# Patient Record
Sex: Male | Born: 2000 | Race: White | Hispanic: No | Marital: Single | Smoking: Never smoker
Health system: Southern US, Community
[De-identification: ages and names within clinical notes are randomized; demographics above are authoritative.]

---

## 2020-06-10 ENCOUNTER — Emergency Department
Admission: EM | Admit: 2020-06-10 | Discharge: 2020-06-10 | Disposition: A | Discharge status: Home / Self Care | Payer: BC Managed Care – PPO | Source: Home / Self Care

## 2020-06-10 ENCOUNTER — Other Ambulatory Visit: Payer: Self-pay

## 2020-06-10 ENCOUNTER — Emergency Department (INDEPENDENT_AMBULATORY_CARE_PROVIDER_SITE_OTHER): Payer: BC Managed Care – PPO

## 2020-06-10 DIAGNOSIS — M25475 Effusion, left foot: Secondary | ICD-10-CM

## 2020-06-10 DIAGNOSIS — M25572 Pain in left ankle and joints of left foot: Secondary | ICD-10-CM

## 2020-06-10 DIAGNOSIS — M25472 Effusion, left ankle: Secondary | ICD-10-CM

## 2020-06-10 DIAGNOSIS — S93402A Sprain of unspecified ligament of left ankle, initial encounter: Secondary | ICD-10-CM

## 2020-06-10 DIAGNOSIS — M79672 Pain in left foot: Secondary | ICD-10-CM

## 2020-06-10 NOTE — ED Provider Notes (Signed)
[facility]    CSN: [id number] Arrival date & time: 06/10/20  1139      History   Chief Complaint Chief Complaint  Patient presents with  . Foot Injury    Left    HPI Adam Villegas is a 20 y.o. male.   [employer] left foot pain since yesterday.  Reports that he smashed his left foot with a large hammer at work.  Reports that he works in a [employer] and when turbines is [employer].  Reports that he has the area wrapped, reports redness, swelling, bruising.  Reports that he is unable to bear weight and walk normally on the left foot and ankle.  Denies previous injury.  Has taken ibuprofen and Tylenol with some relief.  Denies other injury, numbness, tingling, radiating pain, cracking or popping within the joint, other symptoms.  ROS per HPI  The history is provided by the patient.  Foot Injury   History reviewed. No pertinent past medical history.  There are no problems to display for this patient.   History reviewed. No pertinent surgical history.     Home Medications    Prior to Admission medications   Not on File    Family History Family History  Problem Relation Age of Onset  . Healthy Mother   . Healthy Father     Social History Social History   Tobacco Use  . Smoking status: Never Smoker  . Smokeless tobacco: Never Used  Vaping Use  . Vaping Use: Never used  Substance Use Topics  . Alcohol use: Not Currently     Allergies   Patient has no known allergies.   Review of Systems Review of Systems   Physical Exam Triage Vital Signs ED Triage Vitals [06/10/20 1156]  Enc Vitals Group     BP      Pulse      Resp      Temp      Temp src      SpO2      Weight      Height      Head Circumference      Peak Flow      Pain Score 6     Pain Loc      Pain Edu?      Excl. in GC?    No data found.  Updated Vital Signs BP 137/77 (BP Location: Left Arm)   Pulse 93   Temp 98.2 F (36.8 C) (Oral)   Resp 16   SpO2 97%    Visual Acuity Right Eye Distance:   Left Eye Distance:   Bilateral Distance:    Right Eye Near:   Left Eye Near:    Bilateral Near:     Physical Exam Vitals and nursing note reviewed.  Constitutional:      Appearance: Normal appearance. He is well-developed.  HENT:     Head: Normocephalic and atraumatic.     Nose: Nose normal.     Mouth/Throat:     Mouth: Mucous membranes are moist.     Pharynx: Oropharynx is clear.  Eyes:     Extraocular Movements: Extraocular movements intact.     Conjunctiva/sclera: Conjunctivae normal.     Pupils: Pupils are equal, round, and reactive to light.  Cardiovascular:     Rate and Rhythm: Normal rate and regular rhythm.     Heart sounds: Normal heart sounds. No murmur heard.   Pulmonary:     Effort: Pulmonary effort is normal. No  respiratory distress.     Breath sounds: Normal breath sounds.  Abdominal:     Palpations: Abdomen is soft.     Tenderness: There is no abdominal tenderness.  Musculoskeletal:        General: Swelling, tenderness and signs of injury present.     Cervical back: Normal range of motion and neck supple.     Comments: L ankle with erythema, swelling, ecchymosis, TTP, limited ROM  Skin:    General: Skin is warm and dry.     Capillary Refill: Capillary refill takes less than 2 seconds.  Neurological:     General: No focal deficit present.     Mental Status: He is alert and oriented to person, place, and time.  Psychiatric:        Mood and Affect: Mood normal.        Behavior: Behavior normal.        Thought Content: Thought content normal.      UC Treatments / Results  Labs (all labs ordered are listed, but only abnormal results are displayed) Labs Reviewed - No data to display  EKG   Radiology DG Ankle Complete Left  Result Date: 06/10/2020 CLINICAL DATA:  Dropped heavy object on ankle EXAM: LEFT ANKLE COMPLETE - 3+ VIEW COMPARISON:  None. FINDINGS: Frontal, oblique, and lateral views were obtained.  No fracture or joint effusion. The joint spaces appear normal. No erosive change. Ankle mortise appears intact. IMPRESSION: No fracture or arthropathy.  Ankle mortise appears intact. Electronically Signed   By: Bretta Bang III M.D.   On: 06/10/2020 12:25   DG Foot Complete Left  Result Date: 06/10/2020 CLINICAL DATA:  Dropped hammer on ankle.  Pain and swelling. EXAM: LEFT FOOT - COMPLETE 3+ VIEW COMPARISON:  None. FINDINGS: There is no evidence of fracture or dislocation. There is no evidence of arthropathy or other focal bone abnormality. Soft tissues are unremarkable. IMPRESSION: Negative. Electronically Signed   By: Kennith Center M.D.   On: 06/10/2020 12:25    Procedures Procedures (including critical care time)  Medications Ordered in UC Medications - No data to display  Initial Impression / Assessment and Plan / UC Course  I have reviewed the triage vital signs and the nursing notes.  Pertinent labs & imaging results that were available during my care of the patient were reviewed by me and considered in my medical decision making (see chart for details).    Left foot pain Left ankle pain Sprain of left ankle  X-rays are negative for any fractures or misalignments May continue ibuprofen and Tylenol as needed for pain Continue with ice and elevation Provided ASO brace to the left ankle in office Provided crutches May need to continue crutches through this week Follow-up with sports medicine or orthopedics if symptoms are persisting Follow-up with the ER for changes in sensation, changes in strength, unrelenting pain, other concerns  Final Clinical Impressions(s) / UC Diagnoses   Final diagnoses:  Left foot pain  Acute left ankle pain  Sprain of left ankle, unspecified ligament, initial encounter     Discharge Instructions     We have given you a lace up ankle brace as well as some crutches  Keep the area wrapped, use ice, may take ibuprofen, Tylenol as needed  for pain  Follow-up with orthopedics or sports medicine when you get home    ED Prescriptions    None     PDMP not reviewed this encounter.   Moshe Cipro, NP 06/10/20  1239  

## 2020-06-10 NOTE — Discharge Instructions (Signed)
We have given you a lace up ankle brace as well as some crutches  Keep the area wrapped, use ice, may take ibuprofen, Tylenol as needed for pain  Follow-up with orthopedics or sports medicine when you get home

## 2020-06-10 NOTE — ED Triage Notes (Signed)
Patient presents to Urgent Care with complaints of left foot pain since yesterday. 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 Swelling and ecchymosis noted, pt has ankle/foot wrapped w/ ace wrap upon arrival.

## 2021-11-13 IMAGING — DX DG ANKLE COMPLETE 3+V*L*
3 series · 3 of 3 positions shown · non-contrast
Comparison: None.

CLINICAL DATA: Dropped heavy object on ankle

EXAM:
LEFT ANKLE COMPLETE - 3+ VIEW

[ankle ap]
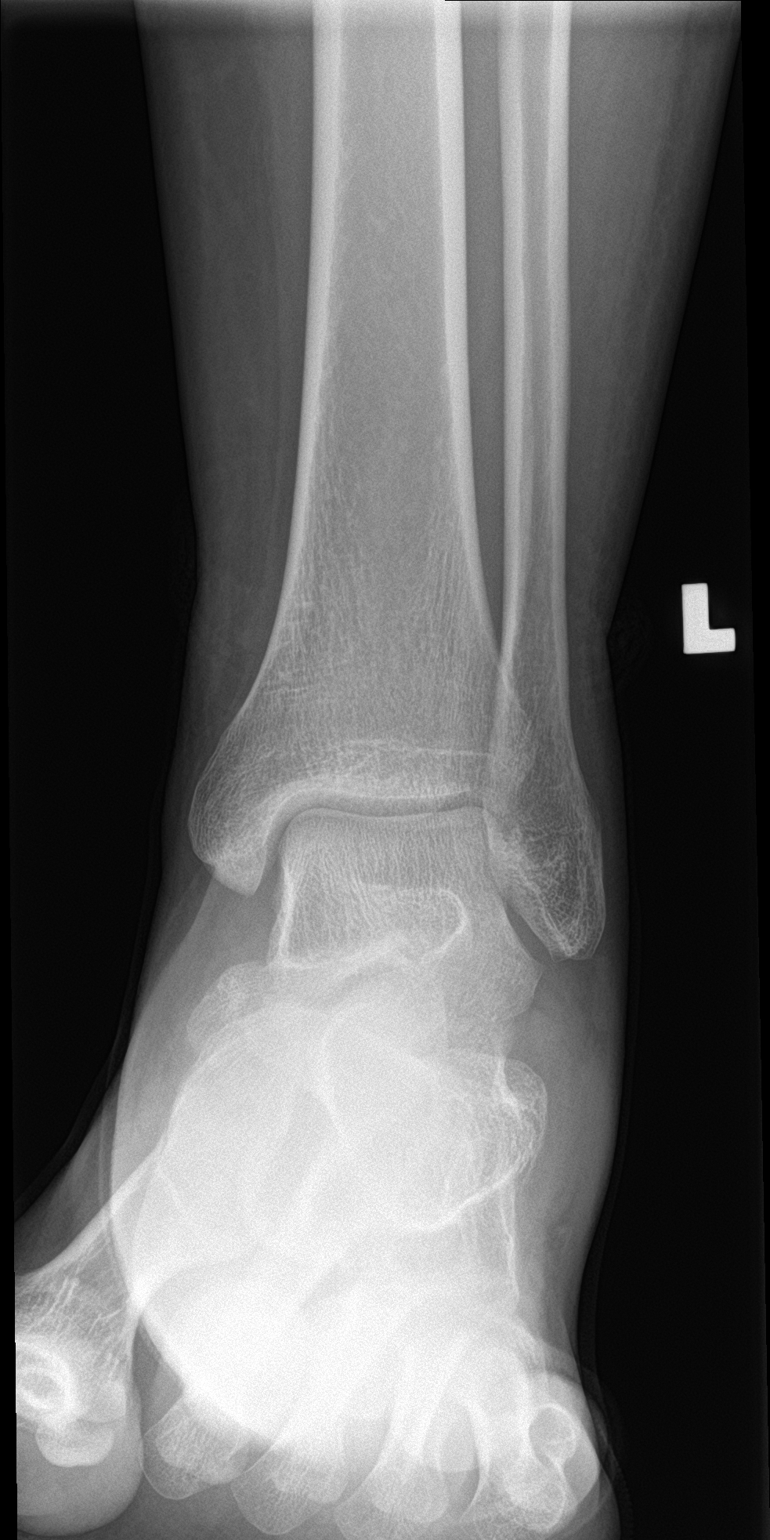

[ankle obl]
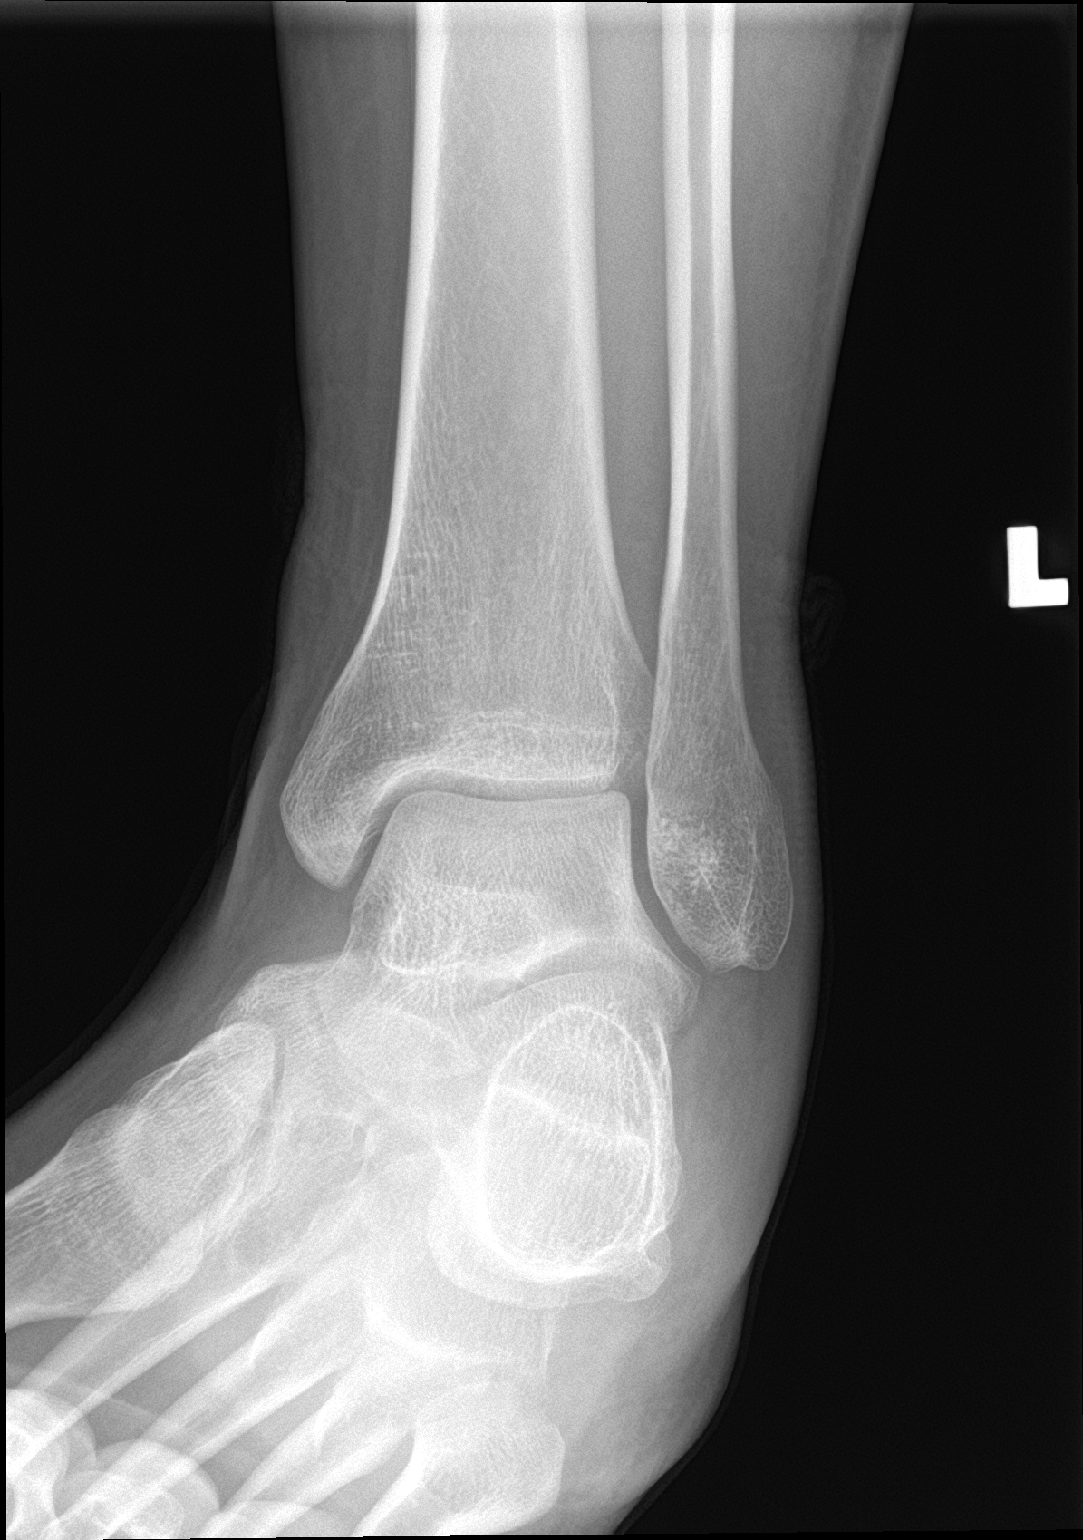

[ankle lat]
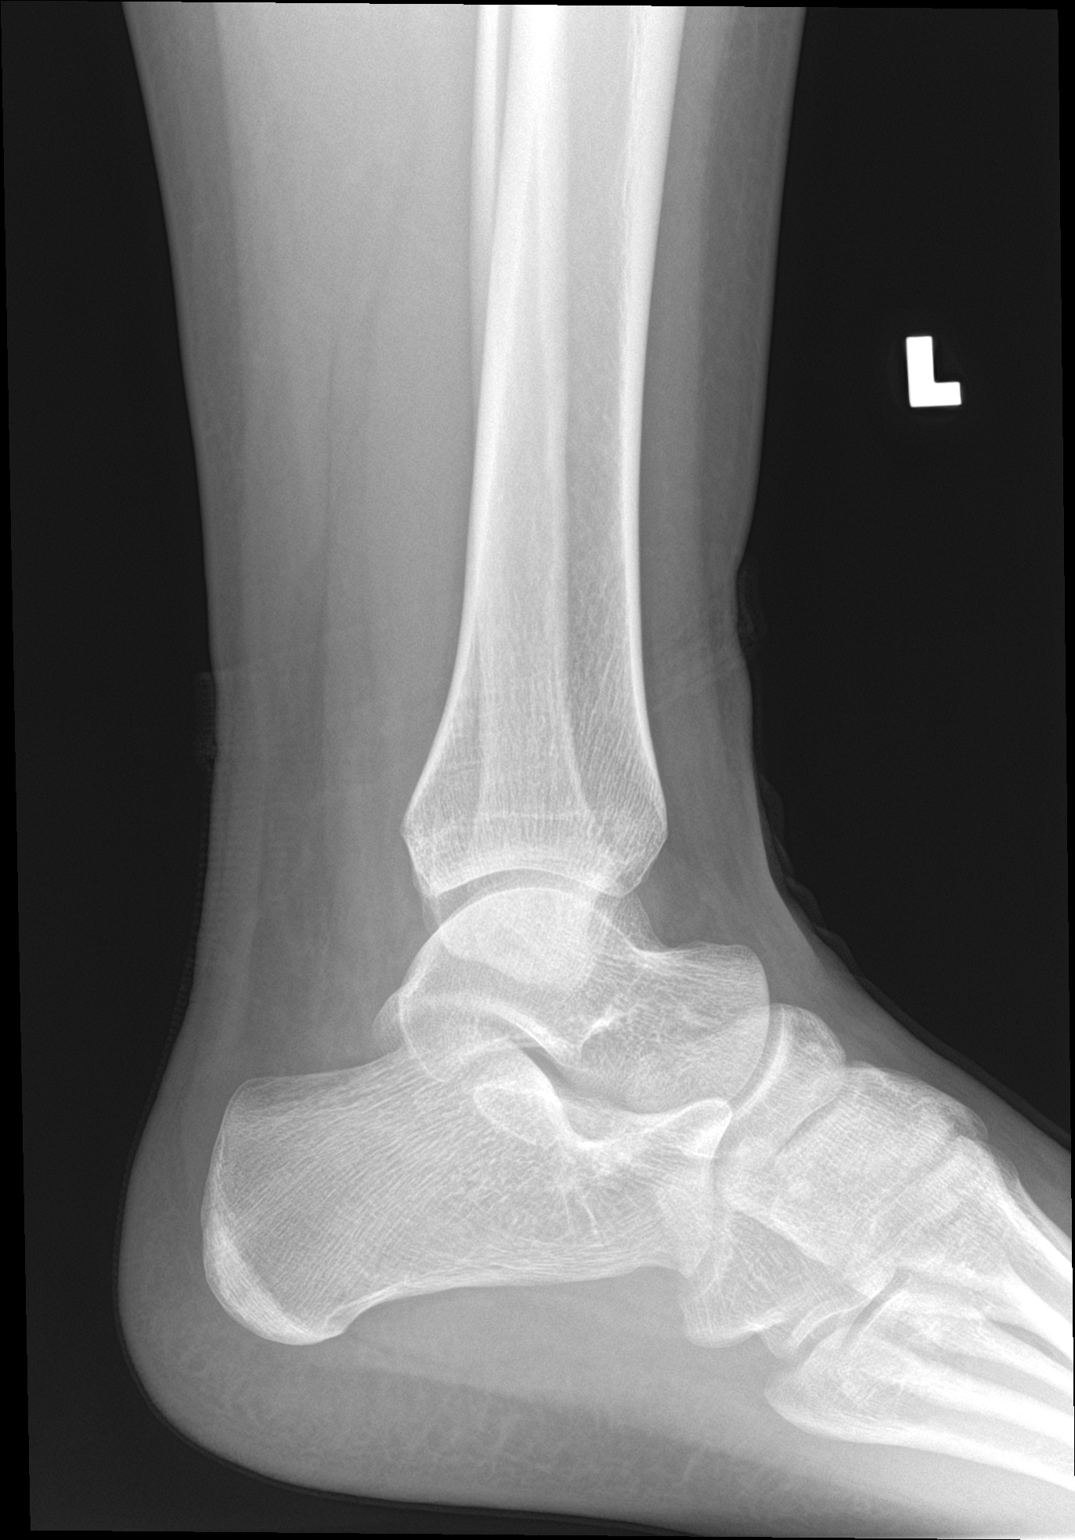

[3 of 3 positions shown; findings below may reference images not displayed]

FINDINGS: Frontal, oblique, and lateral views were obtained. No fracture or
joint effusion. The joint spaces appear normal. No erosive change.
Ankle mortise appears intact.
IMPRESSION: No fracture or arthropathy.  Ankle mortise appears intact.

## 2021-11-13 IMAGING — DX DG FOOT COMPLETE 3+V*L*
3 series · 3 of 3 positions shown · non-contrast
Comparison: None.

CLINICAL DATA: Dropped Chelsie on ankle.  Pain and swelling.

EXAM:
LEFT FOOT - COMPLETE 3+ VIEW

[foot ap]
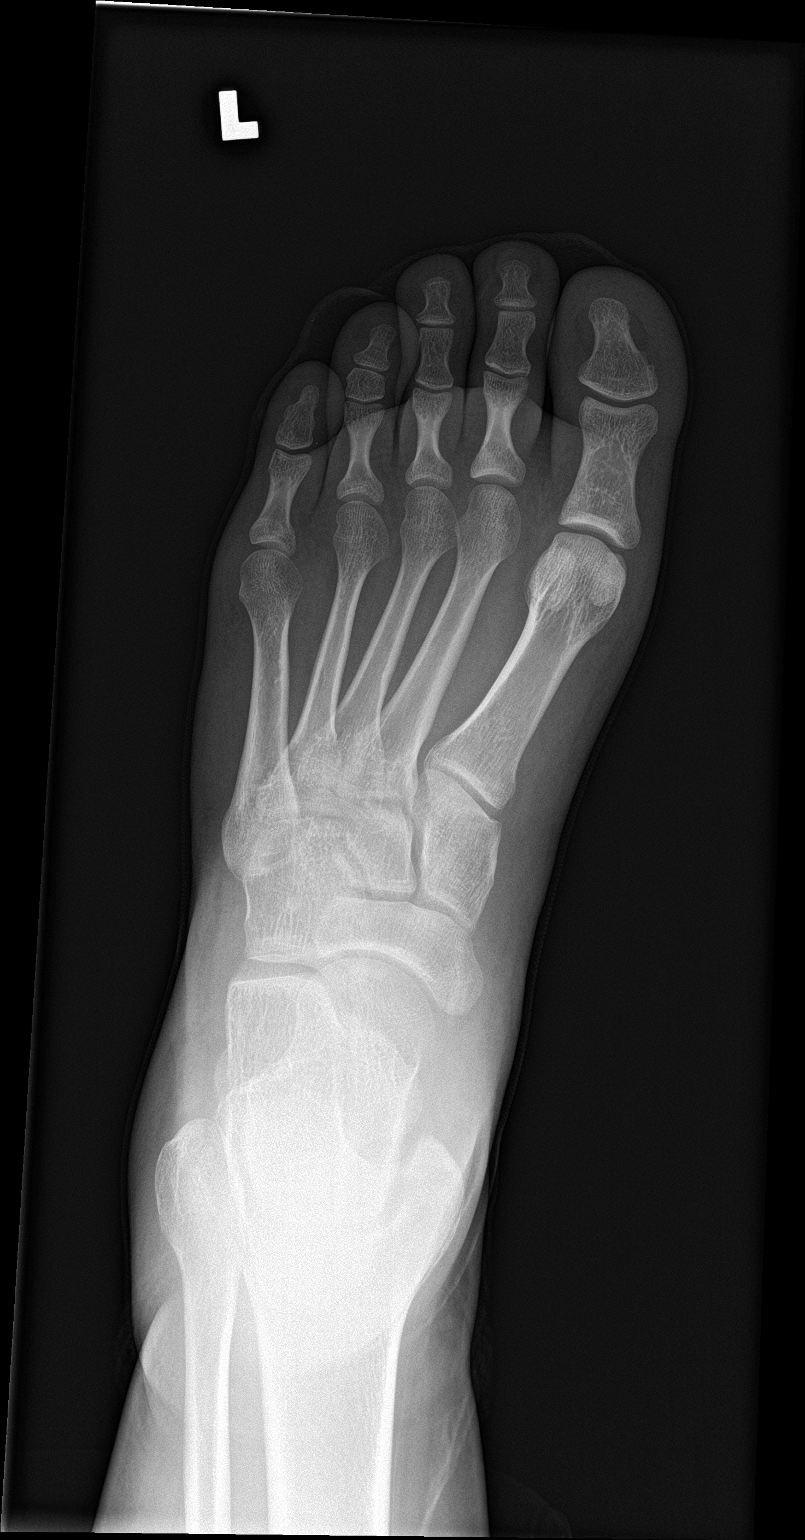

[foot obl]
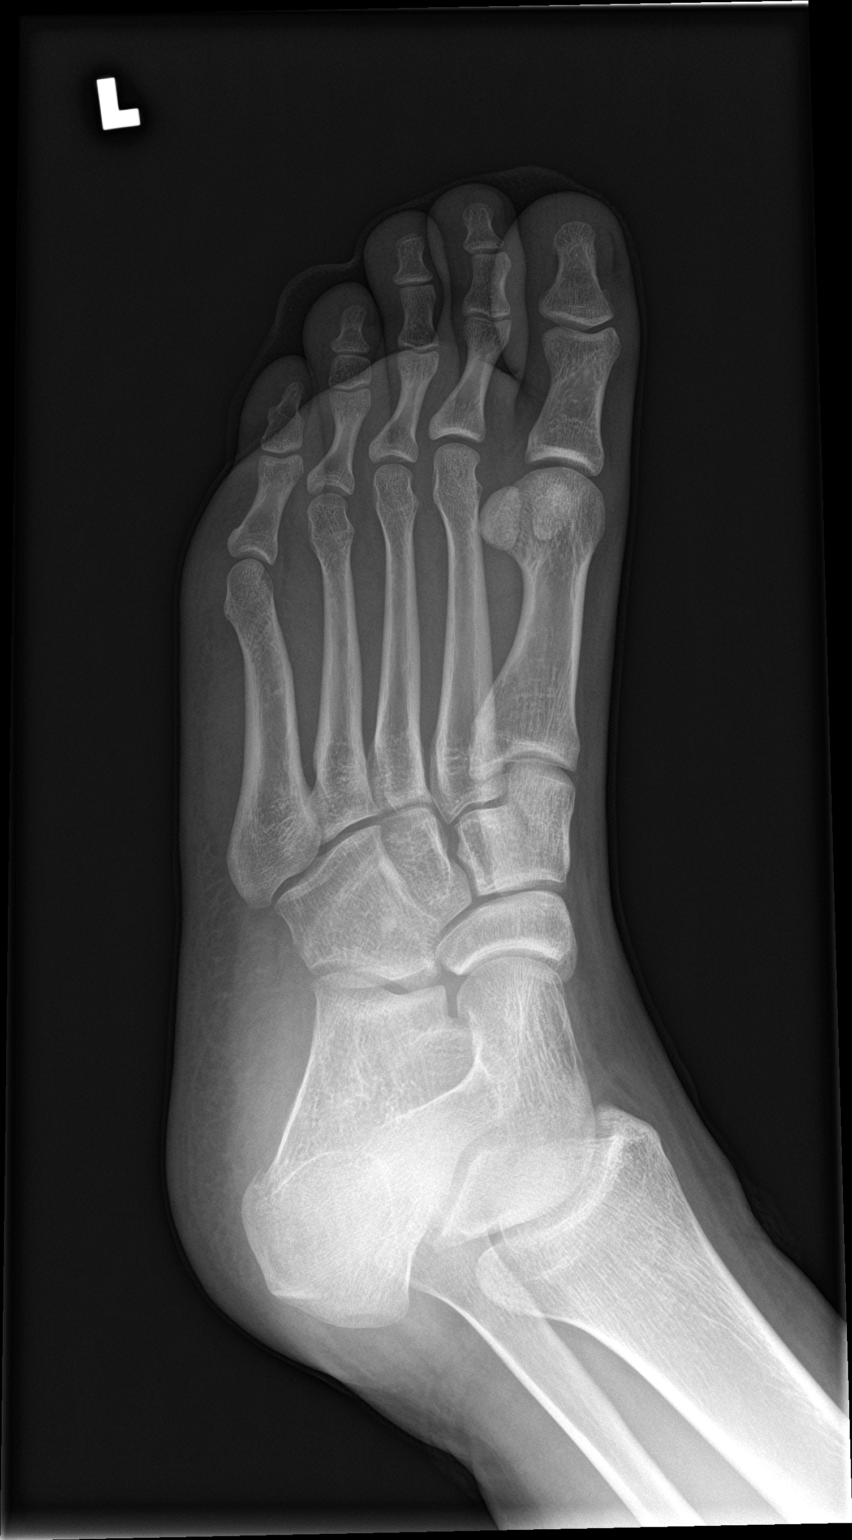

[foot lat]
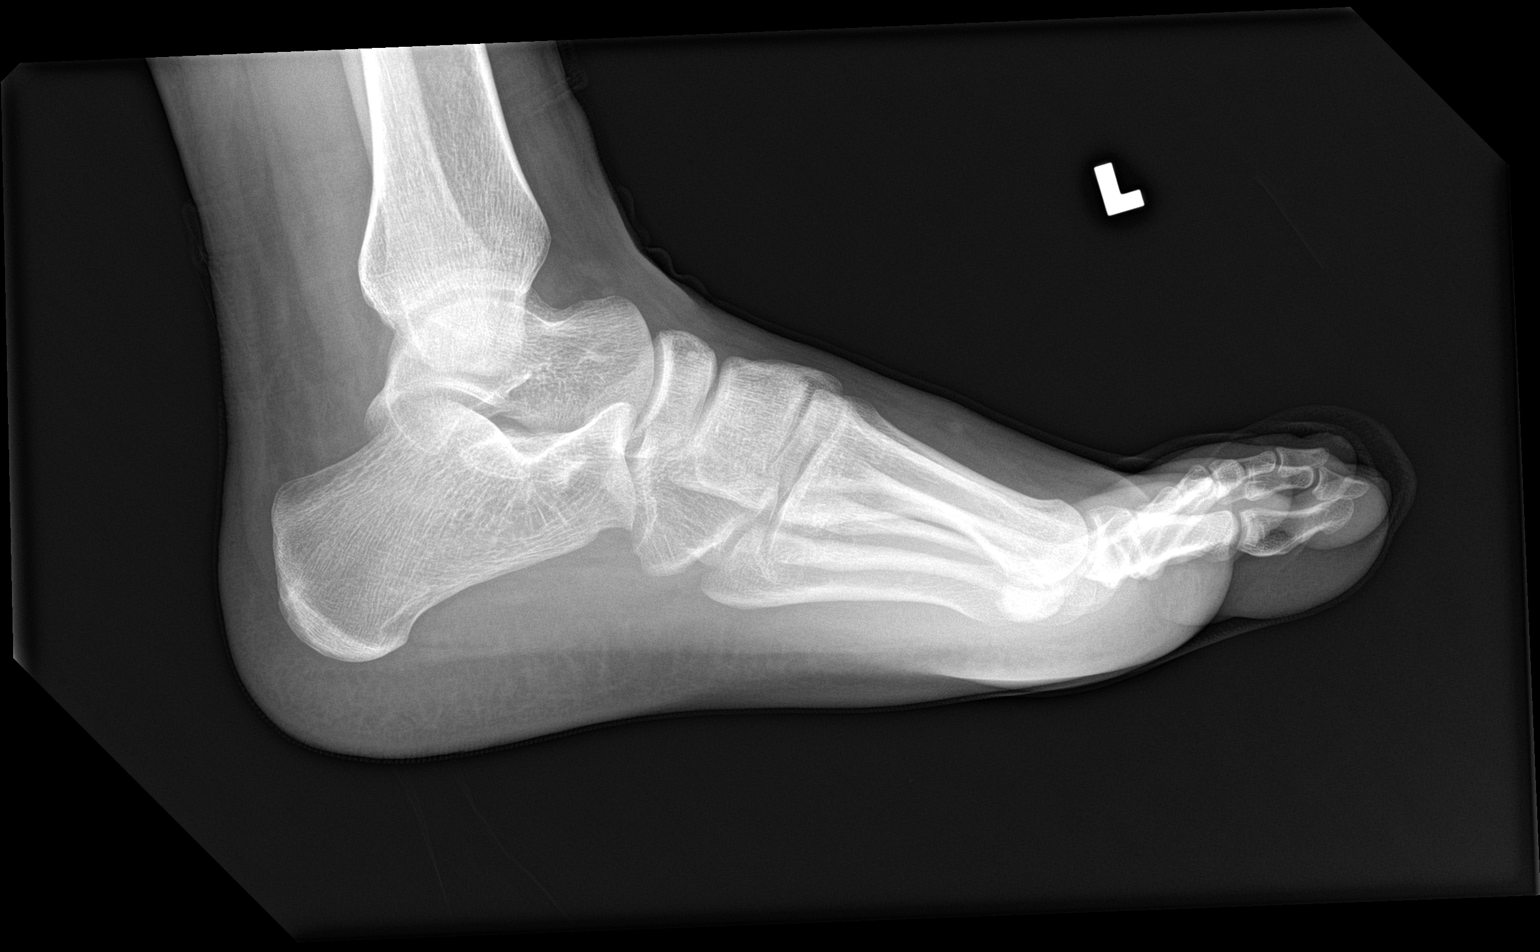

[3 of 3 positions shown; findings below may reference images not displayed]

FINDINGS: There is no evidence of fracture or dislocation. There is no
evidence of arthropathy or other focal bone abnormality. Soft
tissues are unremarkable.
IMPRESSION: Negative.
